# Patient Record
Sex: Male | Born: 1995 | Race: Black or African American | Hispanic: No | Marital: Single | Smoking: Never smoker
Health system: Southern US, Community
[De-identification: ages and names within clinical notes are randomized; demographics above are authoritative.]

---
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
## 2017-08-11 ENCOUNTER — Inpatient Hospital Stay (HOSPITAL_COMMUNITY)
Admission: EM | Admit: 2017-08-11 | Discharge: 2017-08-14 | DRG: 641 | Disposition: A | Discharge status: Home / Self Care | Payer: No Typology Code available for payment source | Attending: Internal Medicine | Admitting: Internal Medicine

## 2017-08-11 ENCOUNTER — Encounter (HOSPITAL_COMMUNITY): Payer: Self-pay | Admitting: *Deleted

## 2017-08-11 ENCOUNTER — Other Ambulatory Visit: Payer: Self-pay

## 2017-08-11 DIAGNOSIS — R748 Abnormal levels of other serum enzymes: Secondary | ICD-10-CM

## 2017-08-11 DIAGNOSIS — K1230 Oral mucositis (ulcerative), unspecified: Secondary | ICD-10-CM | POA: Diagnosis present

## 2017-08-11 DIAGNOSIS — R824 Acetonuria: Secondary | ICD-10-CM | POA: Diagnosis present

## 2017-08-11 DIAGNOSIS — K011 Impacted teeth: Secondary | ICD-10-CM | POA: Diagnosis present

## 2017-08-11 DIAGNOSIS — R490 Dysphonia: Secondary | ICD-10-CM | POA: Diagnosis present

## 2017-08-11 DIAGNOSIS — E162 Hypoglycemia, unspecified: Secondary | ICD-10-CM | POA: Diagnosis present

## 2017-08-11 DIAGNOSIS — E860 Dehydration: Secondary | ICD-10-CM | POA: Diagnosis present

## 2017-08-11 DIAGNOSIS — K121 Other forms of stomatitis: Secondary | ICD-10-CM

## 2017-08-11 DIAGNOSIS — E872 Acidosis: Principal | ICD-10-CM | POA: Diagnosis present

## 2017-08-11 DIAGNOSIS — R52 Pain, unspecified: Secondary | ICD-10-CM

## 2017-08-11 DIAGNOSIS — E8729 Other acidosis: Secondary | ICD-10-CM | POA: Diagnosis present

## 2017-08-11 LAB — URINALYSIS, ROUTINE W REFLEX MICROSCOPIC
Bacteria, UA: NONE SEEN
Bilirubin Urine: NEGATIVE
GLUCOSE, UA: NEGATIVE mg/dL
Hgb urine dipstick: NEGATIVE
Ketones, ur: 80 mg/dL — AB
Leukocytes, UA: NEGATIVE
NITRITE: NEGATIVE
Protein, ur: 100 mg/dL — AB
SPECIFIC GRAVITY, URINE: 1.024 (ref 1.005–1.030)
pH: 5 (ref 5.0–8.0)

## 2017-08-11 LAB — CBC
HCT: 50.7 % (ref 39.0–52.0)
Hemoglobin: 16.6 g/dL (ref 13.0–17.0)
MCH: 28.3 pg (ref 26.0–34.0)
MCHC: 32.7 g/dL (ref 30.0–36.0)
MCV: 86.4 fL (ref 78.0–100.0)
PLATELETS: 270 10*3/uL (ref 150–400)
RBC: 5.87 MIL/uL — ABNORMAL HIGH (ref 4.22–5.81)
RDW: 12.5 % (ref 11.5–15.5)
WBC: 5.1 10*3/uL (ref 4.0–10.5)

## 2017-08-11 LAB — COMPREHENSIVE METABOLIC PANEL
ALK PHOS: 70 U/L (ref 38–126)
ALT: 15 U/L (ref 0–44)
AST: 38 U/L (ref 15–41)
Albumin: 4.3 g/dL (ref 3.5–5.0)
Anion gap: 23 — ABNORMAL HIGH (ref 5–15)
BILIRUBIN TOTAL: 2.2 mg/dL — AB (ref 0.3–1.2)
BUN: 12 mg/dL (ref 6–20)
CALCIUM: 9.7 mg/dL (ref 8.9–10.3)
CO2: 21 mmol/L — ABNORMAL LOW (ref 22–32)
CREATININE: 1.07 mg/dL (ref 0.61–1.24)
Chloride: 93 mmol/L — ABNORMAL LOW (ref 98–111)
GFR calc Af Amer: >60 mL/min (ref >60)
GFR calc non Af Amer: >60 mL/min (ref >60)
GLUCOSE: 69 mg/dL — AB (ref 70–99)
Potassium: 3.4 mmol/L — ABNORMAL LOW (ref 3.5–5.1)
Sodium: 137 mmol/L (ref 135–145)
TOTAL PROTEIN: 8.5 g/dL — AB (ref 6.5–8.1)

## 2017-08-11 LAB — GROUP A STREP BY PCR: Group A Strep by PCR: NOT DETECTED

## 2017-08-11 LAB — CBG MONITORING, ED: GLUCOSE-CAPILLARY: 169 mg/dL — AB (ref 70–99)

## 2017-08-11 LAB — CK: Total CK: 687 U/L — ABNORMAL HIGH (ref 49–397)

## 2017-08-11 LAB — LIPASE, BLOOD: Lipase: 33 U/L (ref 11–51)

## 2017-08-11 MED ORDER — MAGIC MOUTHWASH
10.0000 mL | Freq: Once | ORAL | Status: AC
  Administered 2017-08-11: 10 mL via ORAL
  Filled 2017-08-11: qty 10

## 2017-08-11 MED ORDER — DEXTROSE 50 % IV SOLN
1.0000 | Freq: Once | INTRAVENOUS | Status: AC
  Administered 2017-08-11: 50 mL via INTRAVENOUS
  Filled 2017-08-11: qty 50

## 2017-08-11 MED ORDER — SODIUM CHLORIDE 0.9 % IV BOLUS
1000.0000 mL | Freq: Once | INTRAVENOUS | Status: AC
  Administered 2017-08-11: 1000 mL via INTRAVENOUS

## 2017-08-11 NOTE — ED Triage Notes (Signed)
Pt c/o abd pain sorrethroat body aches  Sores in his mouth for 4 days

## 2017-08-11 NOTE — ED Provider Notes (Addendum)
[facility] Provider Note   CSN: [id number] Arrival date & time: 08/11/17  1939     History   Chief Complaint Chief Complaint  Patient presents with  . Abdominal Pain    HPI Isaac Washington is a 22 y.o. male.  The history is provided by the patient and a parent. No language interpreter was used.  Abdominal Pain       22 year old male without significant past medical history presenting for evaluation of sore throat.  Patient report for the past 3 to 4 days he has had sores in his mouth that is painful, loss of appetite, dark urine, generalized body aches, subjective fever, and yesterday he lost his voice.  He endorsed spitting up trace of blood and felt nauseous.  He has no appetite.  Dad who is in the room mentioned that patient may have lost quite a bit of weight within the past several weeks.  He denies any specific treatment tried at home.  Denies any recent sick contact.  Denies severe headache, neck stiffness, chest pain, shortness of breath, productive cough, abdominal cramping, dysuria, hematuria, rash or penile discharge.  He is sexually active with opposite sex and denies any prior history of STI.  Patient however denies abdominal pain.  Patient did report having some joint pain to his toes and fingers the other day but that has since resolved.  History reviewed. No pertinent past medical history.  There are no active problems to display for this patient.   History reviewed. No pertinent surgical history.      Home Medications    Prior to Admission medications   Not on File    Family History No family history on file.  Social History Social History   Tobacco Use  . Smoking status: Never Smoker  . Smokeless tobacco: Never Used  Substance Use Topics  . Alcohol use: Never    Frequency: Never  . Drug use: Yes    Types: Marijuana     Allergies   Patient has no known allergies.   Review of Systems Review of Systems   Gastrointestinal: Positive for abdominal pain.  All other systems reviewed and are negative.    Physical Exam Updated Vital Signs BP (!) 142/89 (BP Location: Right Arm)   Pulse 88   Temp 99.4 F (37.4 C) (Oral)   Resp 14   Ht 5\' 5"  (1.651 m)   Wt 63.5 kg   SpO2 100%   BMI 23.30 kg/m   Physical Exam  Constitutional: He appears well-developed and well-nourished. No distress.  Nontoxic in appearance  HENT:  Head: Normocephalic and atraumatic.  Mouth/Throat: Oropharyngeal exudate present.  Ears: Normal TMs bilaterally Nose: Normal nares Throat: Multiple ulcerated lesions noted to the posterior buccal mucosa with a similar lesion on the left lateral tongue that is tender to palpation.  Uvula midline bilateral tonsillar enlargement with exudates.  Eyes: Conjunctivae are normal.  Neck: Neck supple.  Cardiovascular: Normal rate and regular rhythm.  Pulmonary/Chest: Effort normal and breath sounds normal. No stridor. He has no wheezes. He has no rhonchi. He has no rales.  Abdominal: Soft. Normal appearance. There is no tenderness. There is no guarding.  Neurological: He is alert.  Skin: No rash noted.  Psychiatric: He has a normal mood and affect.  Nursing note and vitals reviewed.    ED Treatments / Results  Labs (all labs ordered are listed, but only abnormal results are displayed) Labs Reviewed  COMPREHENSIVE METABOLIC PANEL -  Abnormal; Notable for the following components:      Result Value   Potassium 3.4 (*)    Chloride 93 (*)    CO2 21 (*)    Glucose, Bld 69 (*)    Total Protein 8.5 (*)    Total Bilirubin 2.2 (*)    Anion gap 23 (*)    All other components within normal limits  CBC - Abnormal; Notable for the following components:   RBC 5.87 (*)    All other components within normal limits  URINALYSIS, ROUTINE W REFLEX MICROSCOPIC - Abnormal; Notable for the following components:   Ketones, ur 80 (*)    Protein, ur 100 (*)    All other components within  normal limits  CK - Abnormal; Notable for the following components:   Total CK 687 (*)    All other components within normal limits  CBG MONITORING, ED - Abnormal; Notable for the following components:   Glucose-Capillary 169 (*)    All other components within normal limits  GROUP A STREP BY PCR  HSV CULTURE AND TYPING  LIPASE, BLOOD  RAPID HIV SCREEN (HIV 1/2 AB+AG)  RPR  GC/CHLAMYDIA PROBE AMP ([place]) NOT AT [facility]    EKG None  Radiology No results found.  Procedures Procedures (including critical care time)  Medications Ordered in ED Medications  sodium chloride 0.9 % bolus 1,000 mL (1,000 mLs Intravenous New Bag/Given 08/11/17 2246)  magic mouthwash (10 mLs Oral Given 08/11/17 2333)  dextrose 50 % solution 50 mL (50 mLs Intravenous Given 08/11/17 2244)     Initial Impression / Assessment and Plan / ED Course  I have reviewed the triage vital signs and the nursing notes.  Pertinent labs & imaging results that were available during my care of the patient were reviewed by me and considered in my medical decision making (see chart for details).     BP (!) 142/61   Pulse 79   Temp 99.4 F (37.4 C) (Oral)   Resp 14   Ht 5\' 5"  (1.651 m)   Wt 63.5 kg   SpO2 98%   BMI 23.30 kg/m    Final Clinical Impressions(s) / ED Diagnoses   Final diagnoses:  Stomatitis and mucositis  Dehydration    ED Discharge Orders    None     Patient here with sores in his mouth, generalized fatigue, weight loss, polyarthralgias.  He has been eating drinking much, he is losing weight.  On exam, he has multiple ulceration in the posterior aspect of his buccal mucosa as well as lesion in his mouth.  Questionable Bercets, versus oral herpes of the stomatitis.  Suspect immunocompromise status.  Patient denies same-sex activity.  He denies any regular NSAID use OTC use that can cause Stevens-Johnson syndrome.  His voice is hoarse, suspect esophagitis/laryngitis.  Lab is remarkable for  hypoglycemia with a CBG of 69 as well as an elevated anion gap of 23.  No underlying history of diabetes documented.  An amp of D50 was given.  Urine shows 80 ketones consistent with dehydration.  Group A strep obtained and it was negative.  I discussed care with Dr. Pilar Plate who also have evaluated pt. will believe patient will benefit from admission for further management of his condition.  IV fluid given, will continue to provide symptomatically treatment.  11:53 PM Patient report having body aches and dark urine, CK obtain, elevated at 687.  Not consistent with rhabdomyolysis.  Appreciate consultation from medicine who agrees to admit  patient for the management of his condition.  Patient is aware of plan and agrees with plan.      Fayrene Helper, PA-C 08/11/17 2356    Sabas Sous, MD 08/12/17 [phone]

## 2017-08-11 NOTE — ED Notes (Signed)
Nurse to draw labs from IV start

## 2017-08-11 NOTE — ED Notes (Signed)
ED Provider at bedside. 

## 2017-08-12 ENCOUNTER — Other Ambulatory Visit: Payer: Self-pay

## 2017-08-12 ENCOUNTER — Encounter (HOSPITAL_COMMUNITY): Payer: Self-pay | Admitting: *Deleted

## 2017-08-12 DIAGNOSIS — K1230 Oral mucositis (ulcerative), unspecified: Secondary | ICD-10-CM

## 2017-08-12 DIAGNOSIS — E8729 Other acidosis: Secondary | ICD-10-CM | POA: Diagnosis present

## 2017-08-12 DIAGNOSIS — E872 Acidosis: Secondary | ICD-10-CM | POA: Diagnosis present

## 2017-08-12 DIAGNOSIS — K121 Other forms of stomatitis: Secondary | ICD-10-CM

## 2017-08-12 LAB — CBC
HCT: 42.1 % (ref 39.0–52.0)
Hemoglobin: 14.3 g/dL (ref 13.0–17.0)
MCH: 28.7 pg (ref 26.0–34.0)
MCHC: 34 g/dL (ref 30.0–36.0)
MCV: 84.4 fL (ref 78.0–100.0)
Platelets: 216 10*3/uL (ref 150–400)
RBC: 4.99 MIL/uL (ref 4.22–5.81)
RDW: 12.5 % (ref 11.5–15.5)
WBC: 5 10*3/uL (ref 4.0–10.5)

## 2017-08-12 LAB — POCT I-STAT 3, VENOUS BLOOD GAS (G3P V)
Acid-base deficit: 1 mmol/L (ref 0.0–2.0)
Bicarbonate: 23.6 mmol/L (ref 20.0–28.0)
O2 SAT: 82 %
PCO2 VEN: 39.8 mmHg — AB (ref 44.0–60.0)
PO2 VEN: 48 mmHg — AB (ref 32.0–45.0)
Patient temperature: 99.4
TCO2: 25 mmol/L (ref 22–32)
pH, Ven: 7.383 (ref 7.250–7.430)

## 2017-08-12 LAB — COMPREHENSIVE METABOLIC PANEL
ALK PHOS: 60 U/L (ref 38–126)
ALT: 13 U/L (ref 0–44)
ANION GAP: 17 — AB (ref 5–15)
AST: 23 U/L (ref 15–41)
Albumin: 3.3 g/dL — ABNORMAL LOW (ref 3.5–5.0)
BILIRUBIN TOTAL: 1.4 mg/dL — AB (ref 0.3–1.2)
BUN: 9 mg/dL (ref 6–20)
CALCIUM: 8.4 mg/dL — AB (ref 8.9–10.3)
CO2: 21 mmol/L — ABNORMAL LOW (ref 22–32)
Chloride: 99 mmol/L (ref 98–111)
Creatinine, Ser: 0.9 mg/dL (ref 0.61–1.24)
GFR calc Af Amer: >60 mL/min (ref >60)
GFR calc non Af Amer: >60 mL/min (ref >60)
Glucose, Bld: 78 mg/dL (ref 70–99)
Potassium: 3.1 mmol/L — ABNORMAL LOW (ref 3.5–5.1)
Sodium: 137 mmol/L (ref 135–145)
TOTAL PROTEIN: 6.5 g/dL (ref 6.5–8.1)

## 2017-08-12 LAB — RESPIRATORY PANEL BY PCR
ADENOVIRUS-RVPPCR: NOT DETECTED
Bordetella pertussis: NOT DETECTED
CHLAMYDOPHILA PNEUMONIAE-RVPPCR: NOT DETECTED
CORONAVIRUS HKU1-RVPPCR: NOT DETECTED
CORONAVIRUS NL63-RVPPCR: NOT DETECTED
Coronavirus 229E: NOT DETECTED
Coronavirus OC43: NOT DETECTED
Influenza A: NOT DETECTED
Influenza B: NOT DETECTED
MYCOPLASMA PNEUMONIAE-RVPPCR: NOT DETECTED
Metapneumovirus: NOT DETECTED
Parainfluenza Virus 1: NOT DETECTED
Parainfluenza Virus 2: NOT DETECTED
Parainfluenza Virus 3: NOT DETECTED
Parainfluenza Virus 4: NOT DETECTED
Respiratory Syncytial Virus: NOT DETECTED
Rhinovirus / Enterovirus: NOT DETECTED

## 2017-08-12 LAB — LACTIC ACID, PLASMA: LACTIC ACID, VENOUS: 1.1 mmol/L (ref 0.5–1.9)

## 2017-08-12 LAB — GC/CHLAMYDIA PROBE AMP (~~LOC~~) NOT AT ARMC
CHLAMYDIA, DNA PROBE: NEGATIVE
NEISSERIA GONORRHEA: NEGATIVE

## 2017-08-12 LAB — RAPID HIV SCREEN (HIV 1/2 AB+AG)
HIV 1/2 Antibodies: NONREACTIVE
HIV-1 P24 Antigen - HIV24: NONREACTIVE

## 2017-08-12 LAB — MAGNESIUM: MAGNESIUM: 1.9 mg/dL (ref 1.7–2.4)

## 2017-08-12 LAB — RPR: RPR Ser Ql: NONREACTIVE

## 2017-08-12 MED ORDER — LACTATED RINGERS IV SOLN
INTRAVENOUS | Status: DC
  Administered 2017-08-13: 06:00:00 via INTRAVENOUS

## 2017-08-12 MED ORDER — ONDANSETRON HCL 4 MG/2ML IJ SOLN: 4.0000 mg | Freq: Four times a day (QID) | INTRAMUSCULAR | Status: DC | PRN

## 2017-08-12 MED ORDER — LACTATED RINGERS IV SOLN
INTRAVENOUS | Status: AC
  Administered 2017-08-12 (×4): via INTRAVENOUS

## 2017-08-12 MED ORDER — OXYCODONE HCL 5 MG/5ML PO SOLN
5.0000 mg | ORAL | Status: DC | PRN
  Administered 2017-08-12: 5 mg via ORAL
  Filled 2017-08-12: qty 5

## 2017-08-12 MED ORDER — POTASSIUM CHLORIDE 10 MEQ/100ML IV SOLN
10.0000 meq | INTRAVENOUS | Status: AC
  Administered 2017-08-12 (×4): 10 meq via INTRAVENOUS
  Filled 2017-08-12 (×4): qty 100

## 2017-08-12 MED ORDER — MAGIC MOUTHWASH W/LIDOCAINE
5.0000 mL | Freq: Four times a day (QID) | ORAL | Status: DC | PRN
  Administered 2017-08-12: 5 mL via ORAL
  Filled 2017-08-12 (×2): qty 5

## 2017-08-12 MED ORDER — ACYCLOVIR 200 MG PO CAPS
400.0000 mg | ORAL_CAPSULE | Freq: Three times a day (TID) | ORAL | Status: DC
  Filled 2017-08-12 (×2): qty 2

## 2017-08-12 MED ORDER — POTASSIUM CHLORIDE 20 MEQ/15ML (10%) PO SOLN
40.0000 meq | Freq: Once | ORAL | Status: DC
  Filled 2017-08-12: qty 30

## 2017-08-12 MED ORDER — ACYCLOVIR 200 MG PO CAPS: 400.0000 mg | ORAL_CAPSULE | Freq: Three times a day (TID) | ORAL | Status: DC

## 2017-08-12 MED ORDER — ENOXAPARIN SODIUM 40 MG/0.4ML ~~LOC~~ SOLN
40.0000 mg | SUBCUTANEOUS | Status: DC
  Administered 2017-08-12 – 2017-08-13 (×2): 40 mg via SUBCUTANEOUS
  Filled 2017-08-12 (×2): qty 0.4

## 2017-08-12 MED ORDER — SODIUM CHLORIDE 0.9% FLUSH
3.0000 mL | Freq: Two times a day (BID) | INTRAVENOUS | Status: DC
  Administered 2017-08-12 – 2017-08-14 (×2): 3 mL via INTRAVENOUS

## 2017-08-12 MED ORDER — ACYCLOVIR 200 MG PO CAPS
400.0000 mg | ORAL_CAPSULE | Freq: Three times a day (TID) | ORAL | Status: DC
  Administered 2017-08-13 – 2017-08-14 (×4): 400 mg via ORAL
  Filled 2017-08-12 (×4): qty 2

## 2017-08-12 MED ORDER — DEXTROSE 5 % IV SOLN
5.0000 mg/kg | Freq: Three times a day (TID) | INTRAVENOUS | Status: AC
  Administered 2017-08-12 – 2017-08-13 (×3): 340 mg via INTRAVENOUS
  Filled 2017-08-12 (×3): qty 6.8

## 2017-08-12 NOTE — H&P (Signed)
History and Physical   Isaac Washington YQM:[id number] DOB: 1995-10-16 DOA: 08/11/2017  PCP: Patient, No Pcp Per  Chief Complaint: mouth sores  HPI: this a 22 year old man with no known chronic medical problems presenting with mouth sores. History is obtained via chart review, sign out from emergency medicine team, and patient report.  Symptoms began 5 days prior to admission with cold sensitivity in his fingers and toes, this progressed to sore throat and mouth with associated pain and sores. 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 He reports smoking marijuana, no cigarettes, no daily alcohol use. He reports other associated symptoms including decreased by mouth intake, weakness, fevers and chills, rib pain, burping, nausea and vomiting. He believes he has lost a few pounds in the past week. Denies any chest pain, shortness of breath, hemoptysis. He's never had these symptoms before. He reports his worse symptom is his mouth sores.  Social history was asked by emergency medicine team, reports sexually active, no prior history of STI. Father reports he has been under increased stress recently.  ED Course: vital signs remarkable for temperature 100.3, normal heart rate, normal respiratory rate, systolic blood pressure 140s. Labs remarkable for potassium 3.4, creatinine 1.07, anion gap 23, bicarbonate 21, T bili 2.2, urinalysis positive for ketones, increased specific gravity.  CBC unremarkable.  Review of Systems: A complete ROS was obtained; pertinent positives negatives are denoted in the HPI. Otherwise, all systems are negative.   History reviewed. No pertinent past medical history. Social History   Socioeconomic History  . Marital status: Single    Spouse name: Not on file  . Number of children: Not on file  . Years of education: Not on file  . Highest education level: Not on file  Occupational History  . Not on file  Social Needs  . Financial resource strain: Not on  file  . Food insecurity:    Worry: Not on file    Inability: Not on file  . Transportation needs:    Medical: Not on file    Non-medical: Not on file  Tobacco Use  . Smoking status: Never Smoker  . Smokeless tobacco: Never Used  Substance and Sexual Activity  . Alcohol use: Never    Frequency: Never  . Drug use: Yes    Types: Marijuana  . Sexual activity: Not on file  Lifestyle  . Physical activity:    Days per week: Not on file    Minutes per session: Not on file  . Stress: Not on file  Relationships  . Social connections:    Talks on phone: Not on file    Gets together: Not on file    Attends religious service: Not on file    Active member of club or organization: Not on file    Attends meetings of clubs or organizations: Not on file    Relationship status: Not on file  . Intimate partner violence:    Fear of current or ex partner: Not on file    Emotionally abused: Not on file    Physically abused: Not on file    Forced sexual activity: Not on file  Other Topics Concern  . Not on file  Social History Narrative  . Not on file   Family hx: Follows alive without known medical problems. Patient denies any family history of autoimmune disorders such as rheumatoid arthritis.  Physical Exam: Vitals:   08/11/17 1958 08/11/17 2246 08/11/17 2300 08/11/17 2330  BP:  136/79 138/89 Marland Kitchen(!)  142/61  Pulse:  78 86 79  Resp:  14  14  Temp:      TempSrc:      SpO2:  97% 98% 98%  Weight: 63.5 kg     Height: 5\' 5"  (1.651 m)      General: Appears calm and comfortable. Young black man. ENT: Grossly normal hearing, aphthous ulcerations noted anterior tongue, full visualization of oropharynx limited due to pain Cardiovascular: RRR. No M/R/G. No LE edema.  Respiratory: CTA bilaterally. No wheezes or crackles. Normal respiratory effort. Abdomen: Soft, non-tender. Bowel sounds present.  Skin: No rash or induration seen on limited exam. Musculoskeletal: Grossly normal tone BUE/BLE.  Appropriate ROM.  Psychiatric: Grossly normal mood and affect. Neurologic: Moves all extremities in coordinated fashion.  I have personally reviewed the following labs, culture data, and imaging studies.  Assessment/Plan:  #Stomatitis #Dehydration #Elevated anion gap acidosis Course: on admission constellation of symptoms including stomatitis, decreased PO intake, nausea, vomiting, fever and chills. Found to have anion gap of 23, ketones in urine, increased urine specific gravity. HIV negative, group A strep negative. A/P: suspect viral illness as driver to symptoms, if these are rule out other inflammatory disorders should be considered; for now will treat supportively with IVF, opioids and magic mouthwash; HSV culture and RVP pending.  GC/chlamydia urine pending. Repeat BMP to assess anion gap in AM.  DVT prophylaxis: Subq Lovenox Code Status: Full code Disposition Plan: Anticipate D/C home in 1-2 days Consults called: none Admission status:  Admit to hospital medicine   Laurell RoofPatrick Kuhlman, MD [facility] [phone]  If 7PM-7AM, please contact night-coverage [email or web address] Password [email or web address]

## 2017-08-12 NOTE — Plan of Care (Signed)
  Problem: Clinical Measurements: Goal: Diagnostic test results will improve Outcome: Progressing   Problem: Nutrition: Goal: Adequate nutrition will be maintained Outcome: Progressing   Problem: Pain Managment: Goal: General experience of comfort will improve Outcome: Progressing   

## 2017-08-12 NOTE — Progress Notes (Signed)
Patient admitted after midnight, please see H&P.  Will start acyclovir while awaiting culture.  Not much better. Labs in AM.   Marlin CanaryJessica Phoua Hoadley DO

## 2017-08-12 NOTE — Progress Notes (Addendum)
Acyclovir for herpes labialis  Acyclovir 400 mg po TID F/u HSV culture   Addendum -RN reports patient unable to take PO -Will change acyclovir to IV for 24 hours, after that PO preferred due to risk / benefit with side effects associated with IV formulation   Baldemar FridayMasters, Niko Penson M 08/12/2017 12:50 PM

## 2017-08-13 ENCOUNTER — Observation Stay (HOSPITAL_COMMUNITY): Payer: No Typology Code available for payment source

## 2017-08-13 DIAGNOSIS — E162 Hypoglycemia, unspecified: Secondary | ICD-10-CM | POA: Diagnosis present

## 2017-08-13 DIAGNOSIS — R748 Abnormal levels of other serum enzymes: Secondary | ICD-10-CM | POA: Diagnosis not present

## 2017-08-13 DIAGNOSIS — K1230 Oral mucositis (ulcerative), unspecified: Secondary | ICD-10-CM | POA: Diagnosis present

## 2017-08-13 DIAGNOSIS — E860 Dehydration: Secondary | ICD-10-CM

## 2017-08-13 DIAGNOSIS — R824 Acetonuria: Secondary | ICD-10-CM | POA: Diagnosis present

## 2017-08-13 DIAGNOSIS — R490 Dysphonia: Secondary | ICD-10-CM | POA: Diagnosis present

## 2017-08-13 DIAGNOSIS — K121 Other forms of stomatitis: Secondary | ICD-10-CM

## 2017-08-13 DIAGNOSIS — E872 Acidosis: Secondary | ICD-10-CM | POA: Diagnosis present

## 2017-08-13 DIAGNOSIS — K011 Impacted teeth: Secondary | ICD-10-CM | POA: Diagnosis present

## 2017-08-13 LAB — BASIC METABOLIC PANEL
Anion gap: 14 (ref 5–15)
BUN: <5 mg/dL — ABNORMAL LOW (ref 6–20)
CO2: 26 mmol/L (ref 22–32)
Calcium: 8.7 mg/dL — ABNORMAL LOW (ref 8.9–10.3)
Chloride: 100 mmol/L (ref 98–111)
Creatinine, Ser: 0.85 mg/dL (ref 0.61–1.24)
GFR calc Af Amer: >60 mL/min (ref >60)
GFR calc non Af Amer: >60 mL/min (ref >60)
Glucose, Bld: 81 mg/dL (ref 70–99)
Potassium: 3.5 mmol/L (ref 3.5–5.1)
Sodium: 140 mmol/L (ref 135–145)

## 2017-08-13 LAB — CBC
HCT: 39.3 % (ref 39.0–52.0)
Hemoglobin: 13.2 g/dL (ref 13.0–17.0)
MCH: 28.6 pg (ref 26.0–34.0)
MCHC: 33.6 g/dL (ref 30.0–36.0)
MCV: 85.1 fL (ref 78.0–100.0)
Platelets: 224 10*3/uL (ref 150–400)
RBC: 4.62 MIL/uL (ref 4.22–5.81)
RDW: 12.2 % (ref 11.5–15.5)
WBC: 5.4 10*3/uL (ref 4.0–10.5)

## 2017-08-13 LAB — CK: Total CK: 522 U/L — ABNORMAL HIGH (ref 49–397)

## 2017-08-13 MED ORDER — MAGIC MOUTHWASH W/LIDOCAINE
5.0000 mL | Freq: Four times a day (QID) | ORAL | Status: DC
  Administered 2017-08-13: 5 mL via ORAL
  Filled 2017-08-13 (×2): qty 5

## 2017-08-13 MED ORDER — SODIUM CHLORIDE 0.9 % IV SOLN
INTRAVENOUS | Status: DC
  Administered 2017-08-13: 14:00:00 via INTRAVENOUS

## 2017-08-13 MED ORDER — MAGIC MOUTHWASH W/LIDOCAINE
5.0000 mL | Freq: Three times a day (TID) | ORAL | Status: DC
  Administered 2017-08-13 – 2017-08-14 (×3): 5 mL via ORAL
  Filled 2017-08-13 (×4): qty 5

## 2017-08-13 NOTE — Plan of Care (Signed)
?  Problem: Education: ?Goal: Knowledge of General Education information will improve ?Description: Including pain rating scale, medication(s)/side effects and non-pharmacologic comfort measures ?Outcome: Progressing ?  ?Problem: Clinical Measurements: ?Goal: Ability to maintain clinical measurements within normal limits will improve ?Outcome: Progressing ?Goal: Respiratory complications will improve ?Outcome: Progressing ?  ?Problem: Nutrition: ?Goal: Adequate nutrition will be maintained ?Outcome: Progressing ?  ?

## 2017-08-13 NOTE — Care Management Note (Signed)
Case Management Note  Patient Details  Name: Isaac Washington MRN: [id number] Date of Birth: [birth year]  Subjective/Objective:                    Action/Plan:  Discussed PCP with patient and parents at bedside. Patient is on parents insurance. Patient can call number on insurance card and be provided with a list of MD's in network and chose from that list. If patient knows a MD that he would like to establish care with he can call MD office directly to see if office is accepting new patients with his insurance.  Patient and parents voiced understanding. Expected Discharge Date:                  Expected Discharge Plan:  Home/Self Care  In-House Referral:     Discharge planning Services  CM Consult  Post Acute Care Choice:  NA Choice offered to:  Patient, Parent  DME Arranged:  N/A DME Agency:  NA  HH Arranged:  NA HH Agency:  NA  Status of Service:  Completed, signed off  If discussed at Long Length of Stay Meetings, dates discussed:    Additional Comments:  [name], [name], RN 08/13/2017, 12:17 PM

## 2017-08-13 NOTE — Progress Notes (Signed)
Came by to see patient, not in room.  Waited 15 min-- did not return.  If able to eat and drink, will plan to d/c later today.  HSV PCR pending.  Schedule magic mouthwash instead of PRN as he was not getting (only 1 time yesterday) Marlin CanaryJessica Rawan Riendeau DO

## 2017-08-13 NOTE — Progress Notes (Addendum)
Progress Note    Isaac Washington  [id number] DOB: 10/19/1995  DOA: 08/11/2017 PCP: Patient, No Pcp Per    Brief Narrative:     Medical records reviewed and are as summarized below:  Isaac Redwine is an 22 y.o. male with no known chronic medical problems presenting with mouth sores. History is obtained via chart review, sign out from emergency medicine team, and patient report.  Symptoms began 5 days prior to admission with cold sensitivity in his fingers and toes, this progressed to sore throat and mouth with associated pain and sores. 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 He reports smoking marijuana, no cigarettes, no daily alcohol use. He reports other associated symptoms including decreased by mouth intake, weakness, fevers and chills, rib pain, burping, nausea and vomiting. He believes he has lost a few pounds in the past week. Denies any chest pain, shortness of breath, hemoptysis. He's never had these symptoms before. He reports his worse symptom is his mouth sores.  Social history was asked by emergency medicine team, reports sexually active, no prior history of STI. Father reports he has been under increased stress recently.  Assessment/Plan:   Active Problems:   Metabolic acidosis, increased anion gap (IAG)   Stomatitis and mucositis   Elevated CK  Severe Stomatitis/mucositis -HSV swab pending -will treat with acyclovir -schedule magic mouthwash to aide with PO intake -encourage PO intake -my fear is w/o improvement, he will become dehydrated again (only mildly improved after acyclovir but able to open mouth -HIV, G/C negative -NP swab negative  Impacted wisdom tooth -right side -outpatient follow up  Dehydration -still not able to take in much PO fluid/food -continue IVF overnight  Elevated anion gap acidosis -due to dehydration/closed   Elevated CK -continue IVF  Family Communication/Anticipated D/C date and plan/Code Status     Family at bedside   Medical Consultants:    None.   Subjective:   Still not able to take much PO intake due to pain and difficulty swallowing pills  Objective:    Vitals:   08/12/17 2123 08/12/17 2230 08/13/17 0536 08/13/17 1600  BP: 132/87  123/83 112/78  Pulse: 76  84 81  Resp: 17  18 16   Temp: (!) 100.4 F (38 C) 99.6 F (37.6 C) 99.2 F (37.3 C) 98.9 F (37.2 C)  TempSrc: Oral  Oral Oral  SpO2: 100%  97% 100%  Weight:      Height:        Intake/Output Summary (Last 24 hours) at 08/13/2017 1603 Last data filed at 08/13/2017 0900 Gross per 24 hour  Intake 3004.73 ml  Output 950 ml  Net 2054.73 ml   Filed Weights   08/11/17 1958 08/12/17 0113  Weight: 63.5 kg 67.7 kg    Exam: Large sores inside of mouth around teeth and on the top of his tongue rrr clear  Data Reviewed:   I have personally reviewed following labs and imaging studies:  Labs: Labs show the following:   Basic Metabolic Panel: Recent Labs  Lab 08/11/17 2005 08/12/17 0520 08/13/17 0436  NA 137 137 140  K 3.4* 3.1* 3.5  CL 93* 99 100  CO2 21* 21* 26  GLUCOSE 69* 78 81  BUN 12 9 <5*  CREATININE 1.07 0.90 0.85  CALCIUM 9.7 8.4* 8.7*  MG  --  1.9  --    GFR Estimated Creatinine Clearance: 118.6 mL/min (by C-G formula based on SCr of 0.85 mg/dL). Liver Function  Tests: Recent Labs  Lab 08/11/17 2005 08/12/17 0520  AST 38 23  ALT 15 13  ALKPHOS 70 60  BILITOT 2.2* 1.4*  PROT 8.5* 6.5  ALBUMIN 4.3 3.3*   Recent Labs  Lab 08/11/17 2005  LIPASE 33   No results for input(s): AMMONIA in the last 168 hours. Coagulation profile No results for input(s): INR, PROTIME in the last 168 hours.  CBC: Recent Labs  Lab 08/11/17 2005 08/12/17 0520 08/13/17 0436  WBC 5.1 5.0 5.4  HGB 16.6 14.3 13.2  HCT 50.7 42.1 39.3  MCV 86.4 84.4 85.1  PLT 270 216 224   Cardiac Enzymes: Recent Labs  Lab 08/11/17 2241 08/13/17 0436  CKTOTAL 687* 522*   BNP (last 3 results) No  results for input(s): PROBNP in the last 8760 hours. CBG: Recent Labs  Lab 08/11/17 2336  GLUCAP 169*   D-Dimer: No results for input(s): DDIMER in the last 72 hours. Hgb A1c: No results for input(s): HGBA1C in the last 72 hours. Lipid Profile: No results for input(s): CHOL, HDL, LDLCALC, TRIG, CHOLHDL, LDLDIRECT in the last 72 hours. Thyroid function studies: No results for input(s): TSH, T4TOTAL, T3FREE, THYROIDAB in the last 72 hours.  Invalid input(s): FREET3 Anemia work up: No results for input(s): VITAMINB12, FOLATE, FERRITIN, TIBC, IRON, RETICCTPCT in the last 72 hours. Sepsis Labs: Recent Labs  Lab 08/11/17 2005 08/12/17 0058 08/12/17 0520 08/13/17 0436  WBC 5.1  --  5.0 5.4  LATICACIDVEN  --  1.1  --   --     Microbiology Recent Results (from the past 240 hour(s))  Group A Strep by PCR     Status: None   Collection Time: 08/11/17  8:04 PM  Result Value Ref Range Status   Group A Strep by PCR NOT DETECTED NOT DETECTED Final    Comment: Performed at [facility], [address] [place], [place] [address]  Respiratory Panel by PCR     Status: None   Collection Time: 08/12/17  1:49 AM  Result Value Ref Range Status   Adenovirus NOT DETECTED NOT DETECTED Final   Coronavirus 229E NOT DETECTED NOT DETECTED Final   Coronavirus HKU1 NOT DETECTED NOT DETECTED Final   Coronavirus NL63 NOT DETECTED NOT DETECTED Final   Coronavirus OC43 NOT DETECTED NOT DETECTED Final   Metapneumovirus NOT DETECTED NOT DETECTED Final   Rhinovirus / Enterovirus NOT DETECTED NOT DETECTED Final   Influenza A NOT DETECTED NOT DETECTED Final   Influenza B NOT DETECTED NOT DETECTED Final   Parainfluenza Virus 1 NOT DETECTED NOT DETECTED Final   Parainfluenza Virus 2 NOT DETECTED NOT DETECTED Final   Parainfluenza Virus 3 NOT DETECTED NOT DETECTED Final   Parainfluenza Virus 4 NOT DETECTED NOT DETECTED Final   Respiratory Syncytial Virus NOT DETECTED NOT DETECTED Final   Bordetella  pertussis NOT DETECTED NOT DETECTED Final   Chlamydophila pneumoniae NOT DETECTED NOT DETECTED Final   Mycoplasma pneumoniae NOT DETECTED NOT DETECTED Final    Comment: Performed at [facility], [address] [place], [place] [address]    Procedures and diagnostic studies:  Dg Orthopantogram  Result Date: 08/13/2017 CLINICAL DATA:  Soft tissue lesions in mouth EXAM: ORTHOPANTOGRAM/PANORAMIC COMPARISON:  None. FINDINGS: No fracture or dislocation. No erosive change or bony destruction. No evidence soft tissue mass. There is an impacted right third molar inferiorly. Relative lucency is seen around this third molar on the right which could indicate a degree of underlying infection. IMPRESSION:  Impacted right inferior third molar. Mild lucency in this area could indicate a degree of infection. No frank bony destruction, however. No fracture or dislocation. No soft tissue mass evident by radiography. Electronically Signed   By: Bretta Bang III M.D.   On: 08/13/2017 13:27    Medications:   . acyclovir  400 mg Oral TID  . enoxaparin (LOVENOX) injection  40 mg Subcutaneous Q24H  . magic mouthwash w/lidocaine  5 mL Oral TID WC & HS  . potassium chloride  40 mEq Oral Once  . sodium chloride flush  3 mL Intravenous Q12H   Continuous Infusions: . sodium chloride 100 mL/hr at 08/13/17 1359     LOS: 0 days   Joseph Art  [facility]   *Please refer to [email or web address], password [email or web address] to get updated schedule on who will round on this patient, as hospitalists switch teams weekly. If 7PM-7AM, please contact night-coverage at [email or web address], password [email or web address] for any overnight needs.  08/13/2017, 4:03 PM

## 2017-08-14 LAB — HSV CULTURE AND TYPING

## 2017-08-14 MED ORDER — MAGIC MOUTHWASH W/LIDOCAINE: 5.0000 mL | Freq: Four times a day (QID) | ORAL | 0 refills | Status: AC

## 2017-08-14 MED ORDER — MAGIC MOUTHWASH W/LIDOCAINE: 5.0000 mL | Freq: Four times a day (QID) | ORAL | 0 refills | Status: DC

## 2017-08-14 MED ORDER — ACYCLOVIR 200 MG PO CAPS: 400.0000 mg | ORAL_CAPSULE | Freq: Three times a day (TID) | ORAL | 0 refills | Status: AC

## 2017-08-14 MED ORDER — ACYCLOVIR 200 MG PO CAPS
400.0000 mg | ORAL_CAPSULE | Freq: Three times a day (TID) | ORAL | 0 refills | Status: DC
Start: 2017-08-14 — End: 2017-08-14

## 2017-08-14 NOTE — Discharge Summary (Signed)
Physician Discharge Summary  Isaac Washington ZOX:[id number] DOB: November 10, 1995 DOA: 08/11/2017  PCP: Patient, No Pcp Per  Admit date: 08/11/2017 Discharge date: 08/14/2017  Admitted From: home Discharge disposition: home   Recommendations for Outpatient Follow-Up:   1. HSV swab pending   Discharge Diagnosis:   Active Problems:   Metabolic acidosis, increased anion gap (IAG)   Stomatitis and mucositis   Elevated CK    Discharge Condition: Improved.  Diet recommendation: soft as tolerated  Wound care: None.  Code status: Full.   History of Present Illness:   22 year old man with no known chronic medical problems presenting with mouth sores. History is obtained via chart review, sign out from emergency medicine team, and patient report.  Symptoms began 5 days prior to admission with cold sensitivity in his fingers and toes, this progressed to sore throat and mouth with associated pain and sores. 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 He reports smoking marijuana, no cigarettes, no daily alcohol use. He reports other associated symptoms including decreased by mouth intake, weakness, fevers and chills, rib pain, burping, nausea and vomiting. He believes he has lost a few pounds in the past week. Denies any chest pain, shortness of breath, hemoptysis. He's never had these symptoms before. He reports his worse symptom is his mouth sores.  Social history was asked by emergency medicine team, reports sexually active, no prior history of STI. Father reports he has been under increased stress recently.   Hospital Course by Problem:   Severe Stomatitis/mucositis -HSV swab pending -will treat with acyclovir x 7 days -schedule magic mouthwash to aide with PO intake -HIV, G/C negative -NP swab negative  Impacted wisdom tooth -right side -outpatient follow up  Dehydration -resolved  Elevated anion gap acidosis -due to  dehydration/closed  Elevated CK -trending down with IVF    Medical Consultants:      Discharge Exam:   Vitals:   08/13/17 2207 08/14/17 0507  BP: 130/85 114/68  Pulse: 77 70  Resp: 18 15  Temp: 99.7 F (37.6 C) 98.7 F (37.1 C)  SpO2: 99% 100%   Vitals:   08/13/17 1600 08/13/17 2201 08/13/17 2207 08/14/17 0507  BP: 112/78 128/75 130/85 114/68  Pulse: 81 80 77 70  Resp: 16 18 18 15   Temp: 98.9 F (37.2 C) 98.2 F (36.8 C) 99.7 F (37.6 C) 98.7 F (37.1 C)  TempSrc: Oral Oral Oral Oral  SpO2: 100% 100% 99% 100%  Weight:      Height:        General exam: Appears calm and comfortable. Much improved this AM-- able to take in liquids/soft foods w/o issue  The results of significant diagnostics from this hospitalization (including imaging, microbiology, ancillary and laboratory) are listed below for reference.     Procedures and Diagnostic Studies:   No results found.   Labs:   Basic Metabolic Panel: Recent Labs  Lab 08/11/17 2005 08/12/17 0520 08/13/17 0436  NA 137 137 140  K 3.4* 3.1* 3.5  CL 93* 99 100  CO2 21* 21* 26  GLUCOSE 69* 78 81  BUN 12 9 <5*  CREATININE 1.07 0.90 0.85  CALCIUM 9.7 8.4* 8.7*  MG  --  1.9  --    GFR Estimated Creatinine Clearance: 118.6 mL/min (by C-G formula based on SCr of 0.85 mg/dL). Liver Function Tests: Recent Labs  Lab 08/11/17 2005 08/12/17 0520  AST 38 23  ALT 15 13  ALKPHOS 70 60  BILITOT 2.2* 1.4*  PROT 8.5* 6.5  ALBUMIN 4.3 3.3*   Recent Labs  Lab 08/11/17 2005  LIPASE 33   No results for input(s): AMMONIA in the last 168 hours. Coagulation profile No results for input(s): INR, PROTIME in the last 168 hours.  CBC: Recent Labs  Lab 08/11/17 2005 08/12/17 0520 08/13/17 0436  WBC 5.1 5.0 5.4  HGB 16.6 14.3 13.2  HCT 50.7 42.1 39.3  MCV 86.4 84.4 85.1  PLT 270 216 224   Cardiac Enzymes: Recent Labs  Lab 08/11/17 2241 08/13/17 0436  CKTOTAL 687* 522*   BNP: Invalid input(s):  POCBNP CBG: Recent Labs  Lab 08/11/17 2336  GLUCAP 169*   D-Dimer No results for input(s): DDIMER in the last 72 hours. Hgb A1c No results for input(s): HGBA1C in the last 72 hours. Lipid Profile No results for input(s): CHOL, HDL, LDLCALC, TRIG, CHOLHDL, LDLDIRECT in the last 72 hours. Thyroid function studies No results for input(s): TSH, T4TOTAL, T3FREE, THYROIDAB in the last 72 hours.  Invalid input(s): FREET3 Anemia work up No results for input(s): VITAMINB12, FOLATE, FERRITIN, TIBC, IRON, RETICCTPCT in the last 72 hours. Microbiology Recent Results (from the past 240 hour(s))  Group A Strep by PCR     Status: None   Collection Time: 08/11/17  8:04 PM  Result Value Ref Range Status   Group A Strep by PCR NOT DETECTED NOT DETECTED Final    Comment: Performed at [facility], [address], [place], [place] [address]  Respiratory Panel by PCR     Status: None   Collection Time: 08/12/17  1:49 AM  Result Value Ref Range Status   Adenovirus NOT DETECTED NOT DETECTED Final   Coronavirus 229E NOT DETECTED NOT DETECTED Final   Coronavirus HKU1 NOT DETECTED NOT DETECTED Final   Coronavirus NL63 NOT DETECTED NOT DETECTED Final   Coronavirus OC43 NOT DETECTED NOT DETECTED Final   Metapneumovirus NOT DETECTED NOT DETECTED Final   Rhinovirus / Enterovirus NOT DETECTED NOT DETECTED Final   Influenza A NOT DETECTED NOT DETECTED Final   Influenza B NOT DETECTED NOT DETECTED Final   Parainfluenza Virus 1 NOT DETECTED NOT DETECTED Final   Parainfluenza Virus 2 NOT DETECTED NOT DETECTED Final   Parainfluenza Virus 3 NOT DETECTED NOT DETECTED Final   Parainfluenza Virus 4 NOT DETECTED NOT DETECTED Final   Respiratory Syncytial Virus NOT DETECTED NOT DETECTED Final   Bordetella pertussis NOT DETECTED NOT DETECTED Final   Chlamydophila pneumoniae NOT DETECTED NOT DETECTED Final   Mycoplasma pneumoniae NOT DETECTED NOT DETECTED Final    Comment: Performed at [facility]  Lab, [address], [place], [place] [address]     Discharge Instructions:   Discharge Instructions    Discharge instructions   Complete by:  As directed    Soft diet- avoid acidic foods Follow up with dentist for right lower wisdom tooth impaction   Increase activity slowly   Complete by:  As directed      Allergies as of 08/14/2017   No Known Allergies     Medication List    TAKE these medications   acyclovir 200 MG capsule Commonly known as:  ZOVIRAX Take 2 capsules (400 mg total) by mouth 3 (three) times daily.   magic mouthwash w/lidocaine Soln Take 5 mLs by mouth 4 (four) times daily.   naproxen sodium 220 MG tablet Commonly known as:  ALEVE Take 220 mg by mouth 2 (two) times daily as needed (for  pain).         Time coordinating discharge: 35 min  Signed:  Joseph ArtJessica U Jkai Arwood  [facility] 08/14/2017, 8:36 AM

## 2017-08-14 NOTE — Progress Notes (Signed)
Patient discharged to home. Verbalizes understanding of all discharge instructions including discharge medications and follow up MD visits. Patient accompanied by family. 

## 2019-06-08 IMAGING — DX DG ORTHOPANTOGRAM /PANORAMIC
1 series · 1 of 1 positions shown · non-contrast
Comparison: None.

CLINICAL DATA: Soft tissue lesions in mouth

EXAM:
ORTHOPANTOGRAM/PANORAMIC

[view not recorded]
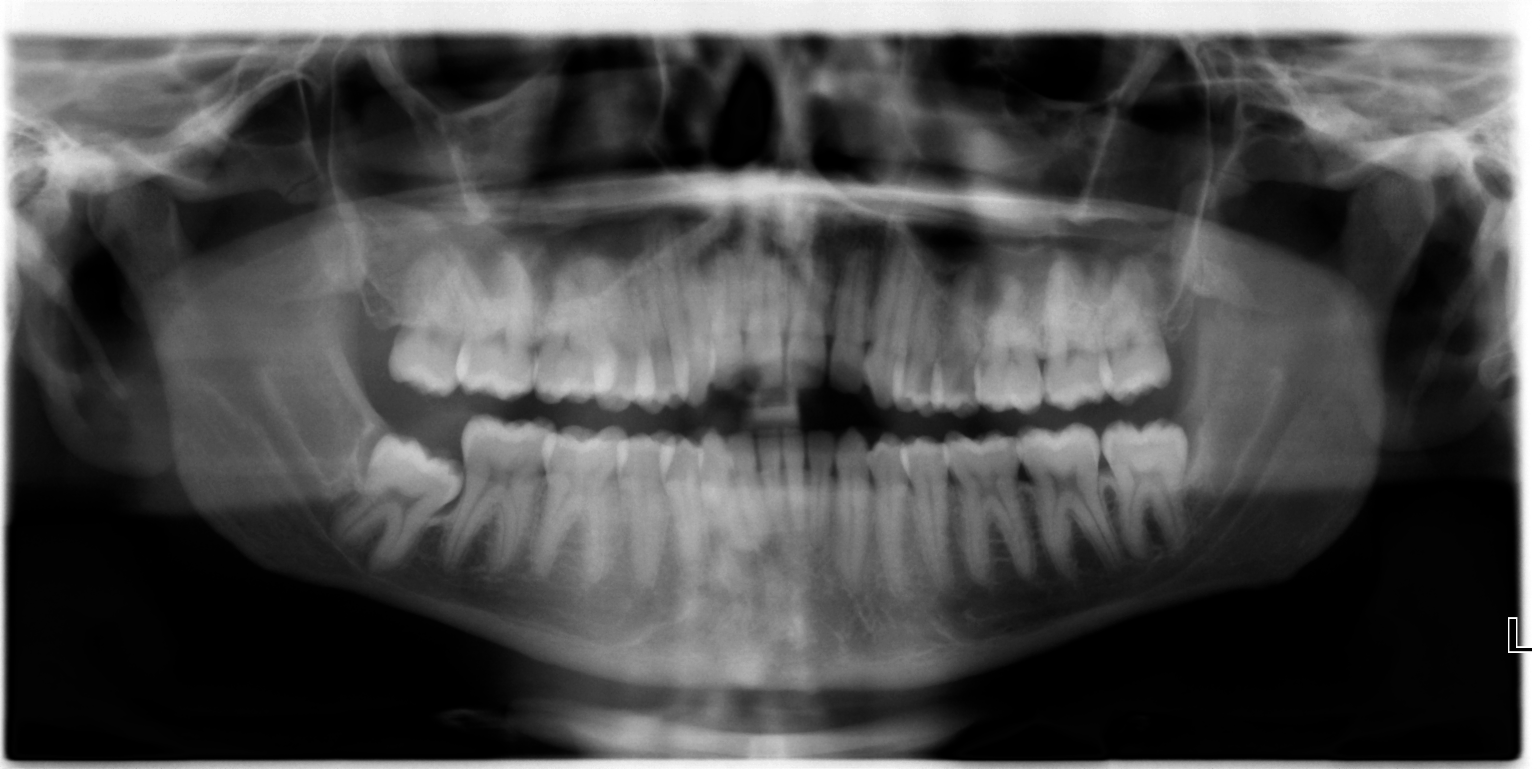

[1 of 1 positions shown; findings below may reference images not displayed]

FINDINGS: No fracture or dislocation. No erosive change or bony destruction.
No evidence soft tissue mass. There is an impacted right third molar
inferiorly. Relative lucency is seen around this third molar on the
right which could indicate a degree of underlying infection.
IMPRESSION: Impacted right inferior third molar. Mild lucency in this area could
indicate a degree of infection. No frank bony destruction, however.
No fracture or dislocation. No soft tissue mass evident by
radiography.
